# Patient Record
Sex: Male | Born: 1990 | Race: Black or African American | Hispanic: No | Marital: Married | Smoking: Current every day smoker
Health system: Southern US, Community
[De-identification: ages and names within clinical notes are randomized; demographics above are authoritative.]

## PROBLEM LIST (undated history)

## (undated) HISTORY — PX: BACK SURGERY: SHX140

---

## 2017-03-19 ENCOUNTER — Encounter (HOSPITAL_COMMUNITY): Payer: Self-pay

## 2017-03-19 ENCOUNTER — Emergency Department (HOSPITAL_COMMUNITY): Payer: No Typology Code available for payment source

## 2017-03-19 ENCOUNTER — Emergency Department (HOSPITAL_COMMUNITY)
Admission: EM | Admit: 2017-03-19 | Discharge: 2017-03-19 | Disposition: A | Discharge status: Home / Self Care | Payer: No Typology Code available for payment source | Attending: Emergency Medicine | Admitting: Emergency Medicine

## 2017-03-19 DIAGNOSIS — Y999 Unspecified external cause status: Secondary | ICD-10-CM | POA: Insufficient documentation

## 2017-03-19 DIAGNOSIS — F17210 Nicotine dependence, cigarettes, uncomplicated: Secondary | ICD-10-CM | POA: Insufficient documentation

## 2017-03-19 DIAGNOSIS — Y929 Unspecified place or not applicable: Secondary | ICD-10-CM | POA: Diagnosis not present

## 2017-03-19 DIAGNOSIS — T07XXXA Unspecified multiple injuries, initial encounter: Secondary | ICD-10-CM

## 2017-03-19 DIAGNOSIS — M545 Low back pain, unspecified: Secondary | ICD-10-CM

## 2017-03-19 DIAGNOSIS — Y9389 Activity, other specified: Secondary | ICD-10-CM | POA: Insufficient documentation

## 2017-03-19 DIAGNOSIS — M25561 Pain in right knee: Secondary | ICD-10-CM | POA: Diagnosis not present

## 2017-03-19 DIAGNOSIS — S40811A Abrasion of right upper arm, initial encounter: Secondary | ICD-10-CM | POA: Insufficient documentation

## 2017-03-19 DIAGNOSIS — S80812A Abrasion, left lower leg, initial encounter: Secondary | ICD-10-CM | POA: Diagnosis not present

## 2017-03-19 DIAGNOSIS — S40812A Abrasion of left upper arm, initial encounter: Secondary | ICD-10-CM | POA: Diagnosis not present

## 2017-03-19 DIAGNOSIS — S80811A Abrasion, right lower leg, initial encounter: Secondary | ICD-10-CM | POA: Diagnosis not present

## 2017-03-19 MED ORDER — NAPROXEN 375 MG PO TABS: 375.0000 mg | ORAL_TABLET | Freq: Two times a day (BID) | ORAL | 0 refills | Status: DC

## 2017-03-19 MED ORDER — NAPROXEN 250 MG PO TABS: 500.0000 mg | ORAL_TABLET | Freq: Once | ORAL | Status: DC

## 2017-03-19 MED ORDER — CYCLOBENZAPRINE HCL 10 MG PO TABS: 10.0000 mg | ORAL_TABLET | Freq: Three times a day (TID) | ORAL | 0 refills | Status: AC | PRN
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
## 2017-03-19 NOTE — Discharge Instructions (Signed)
Your x-ray of your knee showed no fracture. With a knee immobilizer and use the crutches as needed for comfort. Please take the Naproxen as prescribed for pain. Do not take any additional NSAIDs including Motrin, Aleve, Ibuprofen, Advil. Please the the Flexeril for muscle relaxation. This medication will make you drowsy so avoid situation that could place you in danger.   Please rest, ice, compress and elevated the affected body part to help with swelling and pain.  May need to follow-up with Dr. Danielle DessElsner which is a neurosurgery if back pain persists.   May need to follow up with Dr. Ophelia CharterYates if knee pain persists.  Keep abrasions clean and dry. Watch out for signs of infection.

## 2017-03-19 NOTE — ED Notes (Signed)
Pt in x-ray when called. X-ray will transport pt to room.
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
## 2017-03-19 NOTE — Progress Notes (Signed)
Orthopedic Tech Progress Note Patient Details:  Tyler Kerr [birth year] [id number]  Ortho Devices Type of Ortho Device: Knee Immobilizer Ortho Device/Splint Location: RLE Ortho Device/Splint Interventions: Ordered, Application   [name], [name] 03/19/2017, 1:58 PM

## 2017-03-19 NOTE — ED Notes (Signed)
Ellsner to call T. Leaphart @[phone] 

## 2018-01-06 ENCOUNTER — Emergency Department (HOSPITAL_COMMUNITY): Payer: No Typology Code available for payment source

## 2018-01-06 ENCOUNTER — Emergency Department (HOSPITAL_COMMUNITY)
Admission: EM | Admit: 2018-01-06 | Discharge: 2018-01-07 | Disposition: A | Discharge status: Home / Self Care | Payer: No Typology Code available for payment source | Attending: Emergency Medicine | Admitting: Emergency Medicine

## 2018-01-06 ENCOUNTER — Other Ambulatory Visit: Payer: Self-pay

## 2018-01-06 ENCOUNTER — Encounter (HOSPITAL_COMMUNITY): Payer: Self-pay

## 2018-01-06 DIAGNOSIS — Y929 Unspecified place or not applicable: Secondary | ICD-10-CM | POA: Insufficient documentation

## 2018-01-06 DIAGNOSIS — M545 Low back pain, unspecified: Secondary | ICD-10-CM

## 2018-01-06 DIAGNOSIS — S30810A Abrasion of lower back and pelvis, initial encounter: Secondary | ICD-10-CM | POA: Diagnosis not present

## 2018-01-06 DIAGNOSIS — S299XXA Unspecified injury of thorax, initial encounter: Secondary | ICD-10-CM | POA: Diagnosis present

## 2018-01-06 DIAGNOSIS — S20411A Abrasion of right back wall of thorax, initial encounter: Secondary | ICD-10-CM | POA: Insufficient documentation

## 2018-01-06 DIAGNOSIS — R0781 Pleurodynia: Secondary | ICD-10-CM | POA: Insufficient documentation

## 2018-01-06 DIAGNOSIS — Y939 Activity, unspecified: Secondary | ICD-10-CM | POA: Insufficient documentation

## 2018-01-06 DIAGNOSIS — Y999 Unspecified external cause status: Secondary | ICD-10-CM | POA: Insufficient documentation

## 2018-01-06 DIAGNOSIS — F17200 Nicotine dependence, unspecified, uncomplicated: Secondary | ICD-10-CM | POA: Diagnosis not present

## 2018-01-06 DIAGNOSIS — T07XXXA Unspecified multiple injuries, initial encounter: Secondary | ICD-10-CM
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
## 2018-01-07 MED ORDER — IBUPROFEN 400 MG PO TABS: 400.0000 mg | ORAL_TABLET | Freq: Four times a day (QID) | ORAL | 0 refills | Status: AC | PRN

## 2018-01-07 MED ORDER — ACETAMINOPHEN 325 MG PO TABS: 650.0000 mg | ORAL_TABLET | Freq: Four times a day (QID) | ORAL | 0 refills | Status: AC | PRN

## 2018-01-07 MED ORDER — METHOCARBAMOL 500 MG PO TABS: 500.0000 mg | ORAL_TABLET | Freq: Two times a day (BID) | ORAL | 0 refills | Status: DC

## 2018-01-07 NOTE — Discharge Instructions (Signed)

## 2018-02-19 ENCOUNTER — Other Ambulatory Visit: Payer: Self-pay

## 2018-02-19 ENCOUNTER — Emergency Department (HOSPITAL_COMMUNITY)
Admission: EM | Admit: 2018-02-19 | Discharge: 2018-02-20 | Disposition: A | Discharge status: Home / Self Care | Payer: Self-pay | Attending: Emergency Medicine | Admitting: Emergency Medicine

## 2018-02-19 ENCOUNTER — Encounter (HOSPITAL_COMMUNITY): Payer: Self-pay | Admitting: Emergency Medicine

## 2018-02-19 ENCOUNTER — Emergency Department (HOSPITAL_COMMUNITY): Payer: Self-pay

## 2018-02-19 DIAGNOSIS — M545 Low back pain, unspecified: Secondary | ICD-10-CM

## 2018-02-19 DIAGNOSIS — Y9289 Other specified places as the place of occurrence of the external cause: Secondary | ICD-10-CM | POA: Insufficient documentation

## 2018-02-19 DIAGNOSIS — T148XXA Other injury of unspecified body region, initial encounter: Secondary | ICD-10-CM

## 2018-02-19 DIAGNOSIS — X500XXA Overexertion from strenuous movement or load, initial encounter: Secondary | ICD-10-CM | POA: Insufficient documentation

## 2018-02-19 DIAGNOSIS — Y990 Civilian activity done for income or pay: Secondary | ICD-10-CM | POA: Insufficient documentation

## 2018-02-19 DIAGNOSIS — F17210 Nicotine dependence, cigarettes, uncomplicated: Secondary | ICD-10-CM | POA: Insufficient documentation

## 2018-02-19 DIAGNOSIS — S39012A Strain of muscle, fascia and tendon of lower back, initial encounter: Secondary | ICD-10-CM | POA: Insufficient documentation

## 2018-02-19 DIAGNOSIS — Y9389 Activity, other specified: Secondary | ICD-10-CM | POA: Insufficient documentation

## 2018-02-19 NOTE — ED Triage Notes (Signed)
Pt reports L lower back pain after carrying something heavy at work and hearing a "pop" Pt is ambulatory, has tried no OTC measures.

## 2018-02-20 MED ORDER — METHOCARBAMOL 500 MG PO TABS: 500.0000 mg | ORAL_TABLET | Freq: Two times a day (BID) | ORAL | 0 refills | Status: AC

## 2018-02-20 MED ORDER — METHOCARBAMOL 500 MG PO TABS
500.0000 mg | ORAL_TABLET | Freq: Once | ORAL | Status: AC
  Administered 2018-02-20: 500 mg via ORAL
  Filled 2018-02-20: qty 1

## 2018-02-20 MED ORDER — ACETAMINOPHEN 325 MG PO TABS
650.0000 mg | ORAL_TABLET | Freq: Once | ORAL | Status: AC
  Administered 2018-02-20: 650 mg via ORAL
  Filled 2018-02-20: qty 2

## 2018-02-20 MED ORDER — NAPROXEN 500 MG PO TABS: 500.0000 mg | ORAL_TABLET | Freq: Two times a day (BID) | ORAL | 0 refills | Status: AC

## 2018-02-20 NOTE — ED Provider Notes (Signed)
[facility] EMERGENCY DEPARTMENT Provider Note   CSN: [id number] Arrival date & time: 02/19/18  2240     History   Chief Complaint Chief Complaint  Patient presents with  . Back Pain    HPI Tyler Kerr is a 27 y.o. male.  27 y/o male with a PMH of scoliosis presents to the ED complaining of lower back pain x 2 hours. Patient states he was lifting a heavy box when he felt his back give out.Patient currently works for [employer]. Patient describes this pain as a pulling sensation located on his lower back region especially left side with no radiation. Patient is able to ambulate. He denies any abdominal pain, previous history of back pain or any bowel or bladder incontinence.      History reviewed. No pertinent past medical history.  There are no active problems to display for this patient.   Past Surgical History:  Procedure Laterality Date  . BACK SURGERY          Home Medications    Prior to Admission medications   Medication Sig Start Date End Date Taking? Authorizing Provider  acetaminophen (TYLENOL) 325 MG tablet Take 2 tablets (650 mg total) by mouth every 6 (six) hours as needed. Do not take more than 4000mg  of tylenol per day 01/07/18   Couture, Cortni S, PA-C  cyclobenzaprine (FLEXERIL) 10 MG tablet Take 1 tablet (10 mg total) by mouth 3 (three) times daily as needed for muscle spasms. 03/19/17   Rise Mu, PA-C  ibuprofen (ADVIL,MOTRIN) 400 MG tablet Take 1 tablet (400 mg total) by mouth every 6 (six) hours as needed. 01/07/18   Couture, Cortni S, PA-C  methocarbamol (ROBAXIN) 500 MG tablet Take 1 tablet (500 mg total) by mouth 2 (two) times daily for 7 days. 02/20/18 02/27/18  Claude Manges, PA-C  naproxen (NAPROSYN) 500 MG tablet Take 1 tablet (500 mg total) by mouth 2 (two) times daily. 02/20/18   Claude Manges, PA-C    Family History No family history on file.  Social History Social History   Tobacco Use  . Smoking status: Current Every  Day Smoker    Packs/day: 0.50  . Smokeless tobacco: Never Used  Substance Use Topics  . Alcohol use: Yes    Comment: occ  . Drug use: No     Allergies   Patient has no known allergies.   Review of Systems Review of Systems  Constitutional: Negative for chills and fever.  Respiratory: Negative for shortness of breath.   Cardiovascular: Negative for chest pain and palpitations.  Gastrointestinal: Negative for abdominal pain, diarrhea, nausea and vomiting.  Genitourinary: Negative for flank pain and hematuria.  Musculoskeletal: Positive for back pain and myalgias. Negative for gait problem, neck pain and neck stiffness.  Skin: Negative for color change and rash.  Neurological: Negative for light-headedness and headaches.  All other systems reviewed and are negative.    Physical Exam Updated Vital Signs BP 118/76 (BP Location: Left Arm)   Pulse 60   Temp 98.3 F (36.8 C) (Oral)   Resp 18   Ht 5\' 11"  (1.803 m)   Wt 90.7 kg (200 lb)   SpO2 100%   BMI 27.89 kg/m   Physical Exam  Constitutional: He appears well-developed and well-nourished.  HENT:  Head: Normocephalic and atraumatic.  Eyes: Conjunctivae are normal.  Neck: Neck supple.  Cardiovascular: Normal rate and regular rhythm.  No murmur heard. Pulmonary/Chest: Effort normal and breath sounds normal. No respiratory distress.  Abdominal: Soft. There is no tenderness.  Musculoskeletal: He exhibits tenderness. He exhibits no edema.       Cervical back: He exhibits no tenderness and no pain.       Thoracic back: He exhibits no tenderness and no pain.       Lumbar back: He exhibits pain. He exhibits no tenderness and no bony tenderness.       Back:  Tenderness & pain upon palpation to the left lumbar region.No midline tenderness.  Neurological: He is alert.  Skin: Skin is warm and dry.  Psychiatric: He has a normal mood and affect.  Nursing note and vitals reviewed.    ED Treatments / Results  Labs (all  labs ordered are listed, but only abnormal results are displayed) Labs Reviewed - No data to display  EKG None  Radiology Dg Lumbar Spine 2-3 Views  Result Date: 02/19/2018 CLINICAL DATA:  Low back pain EXAM: LUMBAR SPINE - 2-3 VIEW COMPARISON:  01/06/2018 FINDINGS: Partially visualized rods and fixating screws in the thoracolumbar spine. Similar appearance of residual levoscoliosis of the lumbar spine. Vertebral body heights are maintained. Disc spaces are within normal limits. Fracture through fixating screw at L1, no change. IMPRESSION: Scoliosis.  No significant change compared to prior Electronically Signed   By: Jasmine PangKim  Fujinaga M.D.   On: 02/19/2018 23:11    Procedures Procedures (including critical care time)  Medications Ordered in ED Medications  acetaminophen (TYLENOL) tablet 650 mg (has no administration in time range)  methocarbamol (ROBAXIN) tablet 500 mg (has no administration in time range)     Initial Impression / Assessment and Plan / ED Course  I have reviewed the triage vital signs and the nursing notes.  Pertinent labs & imaging results that were available during my care of the patient were reviewed by me and considered in my medical decision making (see chart for details).     Patient presents to the ED complaining of lower back pain x 2 hours. He stated the pain started when he was lifting a heavy box at work. Patient denies any previous history of back pain or bladder/bowel incontinence. Lumbar imaging showed no abnormalities. Patient is ambulatory at this time. No neurological findings.Patient describes his pain as a pulling not tearing, he has no cardiac history.No urinary complains at this time I have explained to the patient to apply warm compresses, ice and light duty for a few days.I have prescribed pain medication and muscle relaxer to take as needed.  Final Clinical Impressions(s) / ED Diagnoses   Final diagnoses:  Acute left-sided low back pain without  sciatica  Muscle strain    ED Discharge Orders        Ordered    naproxen (NAPROSYN) 500 MG tablet  2 times daily     02/20/18 0045    methocarbamol (ROBAXIN) 500 MG tablet  2 times daily     02/20/18 0045       Claude MangesSoto, Momina Hunton, PA-C 02/20/18 0111    Wilkie AyeHorton, Mayer Maskerourtney F, MD 02/20/18 [phone]

## 2018-02-20 NOTE — Discharge Instructions (Addendum)
Today you were seen in the ED and diagnosed with acute low back pain without sciatica and muscle strain.I have prescribed pain medication, please take as directed. Do not drink or drive while taking this medication as it can make you drowsy. For additional pain relief ibuprofen and tylenol can be alternated. Please return to the ED if you experience any of the following symptoms.  You develop new bowel or bladder control problems. You have unusual weakness or numbness in your arms or legs. You develop nausea or vomiting. You develop abdominal pain. You feel faint.

## 2018-02-20 NOTE — ED Notes (Signed)
ED Provider at bedside. 

## 2018-03-19 ENCOUNTER — Other Ambulatory Visit: Payer: Self-pay

## 2018-03-19 ENCOUNTER — Encounter (HOSPITAL_COMMUNITY): Payer: Self-pay | Admitting: Emergency Medicine

## 2018-03-19 ENCOUNTER — Emergency Department (HOSPITAL_COMMUNITY)
Admission: EM | Admit: 2018-03-19 | Discharge: 2018-03-19 | Disposition: A | Discharge status: Home / Self Care | Payer: Self-pay | Attending: Emergency Medicine | Admitting: Emergency Medicine

## 2018-03-19 DIAGNOSIS — Z7689 Persons encountering health services in other specified circumstances: Secondary | ICD-10-CM | POA: Insufficient documentation

## 2018-03-19 DIAGNOSIS — Z79899 Other long term (current) drug therapy: Secondary | ICD-10-CM | POA: Insufficient documentation

## 2018-03-19 DIAGNOSIS — F17200 Nicotine dependence, unspecified, uncomplicated: Secondary | ICD-10-CM | POA: Insufficient documentation

## 2018-03-19 NOTE — Discharge Instructions (Signed)
Please follow-up with the Workmen's Comp. facility of record for any further management of this complaint.

## 2018-03-19 NOTE — ED Provider Notes (Signed)
[facility] Provider Note   CSN: [id number] Arrival date & time: 03/19/18  [id number]     History   Chief Complaint Chief Complaint  Patient presents with  . Letter for School/Work    HPI Tyler Kerr is a 27 y.o. male.  HPI   Tyler Kerr is a 27 y.o. male, presenting to the ED with request for a return to work note.  States he was seen for a back injury here in the ED at the beginning of July.  His symptoms resolved within a couple weeks.  He has Workmen's Comp. paperwork that he says [facility], however, he has not seen them and was not told he needed to see them.  Denies pain, numbness, weakness, or other complaints.  History reviewed. No pertinent past medical history.  There are no active problems to display for this patient.   Past Surgical History:  Procedure Laterality Date  . BACK SURGERY          Home Medications    Prior to Admission medications   Medication Sig Start Date End Date Taking? Authorizing Provider  acetaminophen (TYLENOL) 325 MG tablet Take 2 tablets (650 mg total) by mouth every 6 (six) hours as needed. Do not take more than 4000mg  of tylenol per day 01/07/18   Couture, Cortni S, PA-C  cyclobenzaprine (FLEXERIL) 10 MG tablet Take 1 tablet (10 mg total) by mouth 3 (three) times daily as needed for muscle spasms. 03/19/17   Rise MuLeaphart, Kenneth T, PA-C  ibuprofen (ADVIL,MOTRIN) 400 MG tablet Take 1 tablet (400 mg total) by mouth every 6 (six) hours as needed. 01/07/18   Couture, Cortni S, PA-C  naproxen (NAPROSYN) 500 MG tablet Take 1 tablet (500 mg total) by mouth 2 (two) times daily. 02/20/18   Claude MangesSoto, Johana, PA-C    Family History No family history on file.  Social History Social History   Tobacco Use  . Smoking status: Current Every Day Smoker    Packs/day: 0.50  . Smokeless tobacco: Never Used  Substance Use Topics  . Alcohol use: Yes    Comment: occ  . Drug use: No      Allergies   Patient has no known allergies.   Review of Systems Review of Systems  Constitutional:       Return to work note  Musculoskeletal: Negative for back pain and neck pain.  Neurological: Negative for weakness and numbness.     Physical Exam Updated Vital Signs BP 124/90 (BP Location: Right Arm)   Pulse 64   Temp 98.6 F (37 C) (Oral)   Resp 20   SpO2 98%   Physical Exam  Constitutional: He appears well-developed and well-nourished. No distress.  HENT:  Head: Normocephalic and atraumatic.  Eyes: Conjunctivae are normal.  Neck: Neck supple.  Cardiovascular: Normal rate and regular rhythm.  Pulmonary/Chest: Effort normal.  Musculoskeletal: He exhibits no tenderness.  Patient has no noted tenderness, swelling, or other abnormality throughout the bilateral lower back or buttocks.  He readily twists and bends over without voiced pain or hesitation. Normal motor function intact in all extremities and spine. No midline spinal tenderness.   Neurological: He is alert.  Sensation grossly intact to the lower extremities bilaterally. No saddle anesthesias. Strength 5/5 with flexion and extension at the bilateral hips, knees, and ankles. No noted gait deficit. Coordination intact with heel to shin testing.  Skin: Skin is warm and dry. He is not diaphoretic. No pallor.  Psychiatric: He has a normal mood and affect. His behavior is normal.  Nursing note and vitals reviewed.    ED Treatments / Results  Labs (all labs ordered are listed, but only abnormal results are displayed) Labs Reviewed - No data to display  EKG None  Radiology No results found.  Procedures Procedures (including critical care time)  Medications Ordered in ED Medications - No data to display   Initial Impression / Assessment and Plan / ED Course  I have reviewed the triage vital signs and the nursing notes.  Pertinent labs & imaging results that were available during my care of the  patient were reviewed by me and considered in my medical decision making (see chart for details).     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 Also stated I could not certify that the patient is fit for his job, I can merely state the findings of my exam and that there are no findings that would make me keep him out of work.  He was advised to follow-up with [facility].     Final Clinical Impressions(s) / ED Diagnoses   Final diagnoses:  Return to work evaluation    ED Discharge Orders    None       Concepcion Living 03/19/18 Earl Gala, MD 03/20/18 1005

## 2018-03-19 NOTE — ED Notes (Signed)
Patient able to ambulate independently  

## 2018-03-19 NOTE — ED Triage Notes (Signed)
need work note

## 2019-02-09 ENCOUNTER — Other Ambulatory Visit: Payer: Self-pay

## 2019-02-09 ENCOUNTER — Encounter (HOSPITAL_COMMUNITY): Payer: Self-pay | Admitting: Emergency Medicine

## 2019-02-09 ENCOUNTER — Emergency Department (HOSPITAL_COMMUNITY)
Admission: EM | Admit: 2019-02-09 | Discharge: 2019-02-09 | Disposition: A | Discharge status: Home / Self Care | Payer: Self-pay | Attending: Emergency Medicine | Admitting: Emergency Medicine

## 2019-02-09 ENCOUNTER — Emergency Department (HOSPITAL_COMMUNITY): Payer: Self-pay

## 2019-02-09 DIAGNOSIS — S62323A Displaced fracture of shaft of third metacarpal bone, left hand, initial encounter for closed fracture: Secondary | ICD-10-CM | POA: Insufficient documentation

## 2019-02-09 DIAGNOSIS — Y999 Unspecified external cause status: Secondary | ICD-10-CM | POA: Insufficient documentation

## 2019-02-09 DIAGNOSIS — Y929 Unspecified place or not applicable: Secondary | ICD-10-CM | POA: Insufficient documentation

## 2019-02-09 DIAGNOSIS — S6292XA Unspecified fracture of left wrist and hand, initial encounter for closed fracture: Secondary | ICD-10-CM

## 2019-02-09 DIAGNOSIS — Y939 Activity, unspecified: Secondary | ICD-10-CM | POA: Insufficient documentation

## 2019-02-09 DIAGNOSIS — W19XXXA Unspecified fall, initial encounter: Secondary | ICD-10-CM | POA: Insufficient documentation

## 2019-02-09 DIAGNOSIS — S62325A Displaced fracture of shaft of fourth metacarpal bone, left hand, initial encounter for closed fracture: Secondary | ICD-10-CM | POA: Insufficient documentation

## 2019-02-09 MED ORDER — HYDROCODONE-ACETAMINOPHEN 5-325 MG PO TABS: 1.0000 | ORAL_TABLET | ORAL | 0 refills | Status: AC | PRN
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
## 2019-02-09 NOTE — Progress Notes (Signed)
Orthopedic Tech Progress Note Patient Details:  Duante Arocho [birth year] [id number]  Ortho Devices Type of Ortho Device: Ulna gutter splint, Arm sling Ortho Device/Splint Location: ULE Ortho Device/Splint Interventions: Adjustment, Application, Ordered   Post Interventions Patient Tolerated: Well Instructions Provided: Care of device, Adjustment of device   Janit Pagan 02/09/2019, 2:44 PM

## 2019-02-09 NOTE — Discharge Instructions (Addendum)
You xray today showed a fracture to your fourth and fifth metacarpal. A splint has been placed to your left hand, please keep this clean and dry. I  have also provided a prescription for pain medication, please take this as directed. Dr. Luanna Cole information is attached to your chart, please call to schedule an appointment.

## 2019-02-09 NOTE — ED Notes (Signed)
Patient verbalizes understanding of discharge instructions. Opportunity for questioning and answers were provided. Armband removed by staff, pt discharged from ED. Ambulated out to lobby  
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
## 2019-02-12 ENCOUNTER — Ambulatory Visit: Payer: Self-pay | Admitting: Orthopedic Surgery

## 2019-02-13 ENCOUNTER — Other Ambulatory Visit (HOSPITAL_COMMUNITY): Payer: Self-pay | Attending: Orthopedic Surgery

## 2019-02-13 NOTE — Progress Notes (Signed)
Voicemail message left for Tyler Kerr to inquire his arrival for Covid 19 screen today. Attempted to contact Tyler Kerr  again this afternoon for him to call to reschedule for Covid 19 for 6/26

## 2019-02-13 NOTE — Progress Notes (Signed)
Update to Dr Mardelle Matte office and Amedeo Plenty at [facility]. Mr Gurski did not arrive today for Covid 19 screening. Message left for Mr Spivack to call and hopefully schedule for tomorrow 6/26 prior to his procedure on 6/29.

## 2019-02-17 ENCOUNTER — Ambulatory Visit (HOSPITAL_BASED_OUTPATIENT_CLINIC_OR_DEPARTMENT_OTHER): Admission: RE | Admit: 2019-02-17 | Payer: Self-pay | Source: Home / Self Care | Admitting: Orthopedic Surgery

## 2019-02-17 SURGERY — OPEN REDUCTION INTERNAL FIXATION (ORIF) METACARPAL
Anesthesia: Choice | Laterality: Left

## 2019-03-27 ENCOUNTER — Other Ambulatory Visit: Payer: Self-pay

## 2019-03-27 ENCOUNTER — Emergency Department (HOSPITAL_COMMUNITY)
Admission: EM | Admit: 2019-03-27 | Discharge: 2019-03-27 | Disposition: A | Discharge status: Home / Self Care | Payer: Self-pay | Attending: Emergency Medicine | Admitting: Emergency Medicine

## 2019-03-27 ENCOUNTER — Encounter (HOSPITAL_COMMUNITY): Payer: Self-pay

## 2019-03-27 DIAGNOSIS — Z202 Contact with and (suspected) exposure to infections with a predominantly sexual mode of transmission: Secondary | ICD-10-CM | POA: Insufficient documentation

## 2019-03-27 DIAGNOSIS — F17210 Nicotine dependence, cigarettes, uncomplicated: Secondary | ICD-10-CM | POA: Insufficient documentation

## 2019-03-27 DIAGNOSIS — Z711 Person with feared health complaint in whom no diagnosis is made: Secondary | ICD-10-CM

## 2019-03-27 MED ORDER — LIDOCAINE HCL (PF) 1 % IJ SOLN
INTRAMUSCULAR | Status: AC
  Administered 2019-03-27: 12:00:00 5 mL
  Filled 2019-03-27: qty 5

## 2019-03-27 MED ORDER — AZITHROMYCIN 250 MG PO TABS
1000.0000 mg | ORAL_TABLET | Freq: Once | ORAL | Status: AC
  Administered 2019-03-27: 12:00:00 1000 mg via ORAL
  Filled 2019-03-27: qty 4

## 2019-03-27 MED ORDER — CEFTRIAXONE SODIUM 250 MG IJ SOLR
250.0000 mg | Freq: Once | INTRAMUSCULAR | Status: AC
  Administered 2019-03-27: 250 mg via INTRAMUSCULAR
  Filled 2019-03-27: qty 250

## 2019-03-27 NOTE — ED Triage Notes (Signed)
Pt states that his wife is being tested for std. Pt denies symptoms.

## 2019-03-27 NOTE — Discharge Instructions (Signed)
Follow up with [facility]D clinic to be screened for HIV in the future and for future STD concerns or screenings. This is the recommendation by the CDC for people with multiple sexual partners or hx of STDs. You have been treated for gonorrhea and chlamydia in the ER but the hospital will call you if lab is positive.  Do not have sexual intercourse for 7 days after antibiotic treatment. ° °

## 2019-03-27 NOTE — ED Provider Notes (Signed)
[facility] EMERGENCY DEPARTMENT Provider Note   CSN: [id number] Arrival date & time: 03/27/19  1027    History   Chief Complaint Chief Complaint  Patient presents with  . Exposure to STD    HPI Tyler Kerr is a 28 y.o. male presents to the ED with concern for possible STD exposure.  He reports that his wife was recently told that she tested positive for something but he is unsure what.  He states that she has had abnormal vaginal discharge and that she is prone to BV but when she went to get evaluated for this she tested negative for BV but tested positive for something else.  He denies any urinary symptoms, urethral discharge, testicular pain or scrotal swelling, pain with bowel movements, abdominal pain, nausea, vomiting, fevers, diarrhea, or genital sores.  He is sexually active with his wife and no other partners but does not use protection.     The history is provided by the patient.    History reviewed. No pertinent past medical history.  There are no active problems to display for this patient.   Past Surgical History:  Procedure Laterality Date  . BACK SURGERY          Home Medications    Prior to Admission medications   Medication Sig Start Date End Date Taking? Authorizing Provider  acetaminophen (TYLENOL) 325 MG tablet Take 2 tablets (650 mg total) by mouth every 6 (six) hours as needed. Do not take more than 4000mg  of tylenol per day 01/07/18   Couture, Cortni S, PA-C  cyclobenzaprine (FLEXERIL) 10 MG tablet Take 1 tablet (10 mg total) by mouth 3 (three) times daily as needed for muscle spasms. 03/19/17   Doristine Devoid, PA-C  ibuprofen (ADVIL,MOTRIN) 400 MG tablet Take 1 tablet (400 mg total) by mouth every 6 (six) hours as needed. 01/07/18   Couture, Cortni S, PA-C  naproxen (NAPROSYN) 500 MG tablet Take 1 tablet (500 mg total) by mouth 2 (two) times daily. 02/20/18   Janeece Fitting, PA-C    Family History No family history on  file.  Social History Social History   Tobacco Use  . Smoking status: Current Every Day Smoker    Packs/day: 0.50  . Smokeless tobacco: Never Used  Substance Use Topics  . Alcohol use: Yes    Comment: occ  . Drug use: No     Allergies   Patient has no known allergies.   Review of Systems Review of Systems  Constitutional: Negative for chills and fever.  Gastrointestinal: Negative for abdominal pain, blood in stool, nausea, rectal pain and vomiting.  Genitourinary: Negative for decreased urine volume, discharge, dysuria, flank pain, frequency, genital sores, hematuria, penile pain, penile swelling, scrotal swelling, testicular pain and urgency.  All other systems reviewed and are negative.    Physical Exam Updated Vital Signs BP (!) 140/91 (BP Location: Right Arm)   Pulse (!) 57   Temp 97.9 F (36.6 C) (Oral)   Resp 16   SpO2 98%   Physical Exam Vitals signs and nursing note reviewed.  Constitutional:      General: He is not in acute distress.    Appearance: He is well-developed.     Comments: Resting comfortably in bed  HENT:     Head: Normocephalic and atraumatic.  Eyes:     General:        Right eye: No discharge.        Left eye: No discharge.  Conjunctiva/sclera: Conjunctivae normal.  Neck:     Vascular: No JVD.     Trachea: No tracheal deviation.  Cardiovascular:     Rate and Rhythm: Normal rate and regular rhythm.  Pulmonary:     Effort: Pulmonary effort is normal.     Breath sounds: Normal breath sounds.  Abdominal:     General: Abdomen is flat. Bowel sounds are normal. There is no distension.     Palpations: Abdomen is soft.     Tenderness: There is no abdominal tenderness. There is no right CVA tenderness, left CVA tenderness, guarding or rebound.  Genitourinary:    Comments: Deferred as patient is asymptomatic Skin:    General: Skin is warm and dry.     Findings: No erythema.  Neurological:     Mental Status: He is alert.   Psychiatric:        Behavior: Behavior normal.      ED Treatments / Results  Labs (all labs ordered are listed, but only abnormal results are displayed) Labs Reviewed  [facility]    EKG None  Radiology No results found.  Procedures Procedures (including critical care time)  Medications Ordered in ED Medications  cefTRIAXone (ROCEPHIN) injection 250 mg (has no administration in time range)  azithromycin (ZITHROMAX) tablet 1,000 mg (has no administration in time range)     Initial Impression / Assessment and Plan / ED Course  I have reviewed the triage vital signs and the nursing notes.  Pertinent labs & imaging results that were available during my care of the patient were reviewed by me and considered in my medical decision making (see chart for details).        Patient presenting requesting treatment for possible STD exposure.  He has no symptoms.  He is afebrile, vital signs are stable.  He is nontoxic in appearance.  Patient is afebrile without abdominal tenderness, abdominal pain or painful bowel movements to indicate prostatitis.  STD cultures obtained including gonorrhea and chlamydia. Patient to be discharged with instructions to follow up with health department for any future STD testing or treatment. Discussed importance of using protection when sexually active. Pt understands that they have GC/Chlamydia cultures pending and that they will need to inform all sexual partners if results return positive. Patient has been treated prophylactically with azithromycin and Rocephin.  Discuss strict ED return precautions. Pt verbalized understanding of and agreement with plan and is safe for discharge home at this time.    Final Clinical Impressions(s) / ED Diagnoses   Final diagnoses:  Concern about STD in male without diagnosis    ED Discharge Orders    None       Jeanie SewerFawze, Zakiyah Diop A, PA-C 03/27/19 1137    Rolan BuccoBelfi, Melanie, MD  03/27/19 1225

## 2019-03-28 LAB — GC/CHLAMYDIA PROBE AMP (~~LOC~~) NOT AT ARMC
Chlamydia: NEGATIVE
Neisseria Gonorrhea: NEGATIVE
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
## 2019-08-09 ENCOUNTER — Encounter (HOSPITAL_COMMUNITY): Payer: Self-pay | Admitting: *Deleted

## 2019-08-09 ENCOUNTER — Other Ambulatory Visit: Payer: Self-pay

## 2019-08-09 ENCOUNTER — Emergency Department (HOSPITAL_COMMUNITY)
Admission: EM | Admit: 2019-08-09 | Discharge: 2019-08-09 | Disposition: A | Discharge status: Home / Self Care | Payer: Self-pay | Attending: Emergency Medicine | Admitting: Emergency Medicine

## 2019-08-09 DIAGNOSIS — M549 Dorsalgia, unspecified: Secondary | ICD-10-CM | POA: Insufficient documentation

## 2019-08-09 DIAGNOSIS — B029 Zoster without complications: Secondary | ICD-10-CM | POA: Insufficient documentation

## 2019-08-09 DIAGNOSIS — F17210 Nicotine dependence, cigarettes, uncomplicated: Secondary | ICD-10-CM | POA: Insufficient documentation

## 2019-08-09 MED ORDER — OXYCODONE-ACETAMINOPHEN 5-325 MG PO TABS: 1.0000 | ORAL_TABLET | ORAL | 0 refills | Status: AC | PRN

## 2019-08-09 MED ORDER — ACYCLOVIR 400 MG PO TABS: 800.0000 mg | ORAL_TABLET | Freq: Every day | ORAL | 0 refills | Status: AC

## 2019-08-09 MED ORDER — ACYCLOVIR 200 MG PO CAPS
800.0000 mg | ORAL_CAPSULE | Freq: Once | ORAL | Status: AC
  Administered 2019-08-09: 23:00:00 800 mg via ORAL
  Filled 2019-08-09: qty 4

## 2019-08-09 NOTE — ED Notes (Signed)
[place] to send medication

## 2019-08-09 NOTE — ED Triage Notes (Signed)
The pt has a rash to his lt lateral chest and flank  The rash appears to be shingles/  Pt c/o acute pain

## 2019-08-09 NOTE — Discharge Instructions (Signed)
Take the prescribed medication as directed. °Follow-up with your primary care doctor. °Return to the ED for new or worsening symptoms. °

## 2019-08-09 NOTE — ED Notes (Signed)
Patient verbalizes understanding of discharge instructions. Opportunity for questioning and answers were provided. Armband removed by staff, pt discharged from ED ambulatory to home.  

## 2019-08-09 NOTE — ED Provider Notes (Signed)
[facility] EMERGENCY DEPARTMENT Provider Note   CSN: [id number] Arrival date & time: 08/09/19  2129     History Chief Complaint  Patient presents with  . Rash    Tyler Kerr is a 28 y.o. male.  The history is provided by the patient and medical records.  Rash   28 year old male presenting to the ED with low back pain and rash.  Patient first started noticing this about 2 days ago.  States initially he thought he had pulled a muscle in his back.  Started supportive care measures without any relief.  Rash has gotten worse but has not extended.  Rash begins in left lower back and radiates around to the left lateral abdomen.  No N/V/D.  No fever/chills.  No hx of HIV, DM, or other immunocompromised state.  He did have chicken pox as a child.  No meds PTA.  History reviewed. No pertinent past medical history.  There are no problems to display for this patient.   Past Surgical History:  Procedure Laterality Date  . BACK SURGERY         No family history on file.  Social History   Tobacco Use  . Smoking status: Current Every Day Smoker    Packs/day: 0.50  . Smokeless tobacco: Never Used  Substance Use Topics  . Alcohol use: Yes    Comment: occ  . Drug use: No    Home Medications Prior to Admission medications   Medication Sig Start Date End Date Taking? Authorizing Provider  acetaminophen (TYLENOL) 325 MG tablet Take 2 tablets (650 mg total) by mouth every 6 (six) hours as needed. Do not take more than 4000mg  of tylenol per day 01/07/18   Couture, Cortni S, PA-C  cyclobenzaprine (FLEXERIL) 10 MG tablet Take 1 tablet (10 mg total) by mouth 3 (three) times daily as needed for muscle spasms. 03/19/17   Doristine Devoid, PA-C  ibuprofen (ADVIL,MOTRIN) 400 MG tablet Take 1 tablet (400 mg total) by mouth every 6 (six) hours as needed. 01/07/18   Couture, Cortni S, PA-C  naproxen (NAPROSYN) 500 MG tablet Take 1 tablet (500 mg total) by mouth 2  (two) times daily. 02/20/18   Janeece Fitting, PA-C    Allergies    Patient has no known allergies.  Review of Systems   Review of Systems  Skin: Positive for rash.  All other systems reviewed and are negative.   Physical Exam Updated Vital Signs BP 133/86   Pulse 84   Temp 98.1 F (36.7 C)   Resp 18   Ht 5\' 11"  (1.803 m)   Wt 90.7 kg   SpO2 98%   BMI 27.89 kg/m   Physical Exam Vitals and nursing note reviewed.  Constitutional:      Appearance: He is well-developed.  HENT:     Head: Normocephalic and atraumatic.  Eyes:     Conjunctiva/sclera: Conjunctivae normal.     Pupils: Pupils are equal, round, and reactive to light.  Cardiovascular:     Rate and Rhythm: Normal rate and regular rhythm.     Heart sounds: Normal heart sounds.  Pulmonary:     Effort: Pulmonary effort is normal.     Breath sounds: Normal breath sounds.  Abdominal:     General: Bowel sounds are normal.     Palpations: Abdomen is soft.  Musculoskeletal:        General: Normal range of motion.     Cervical back: Normal range of motion.  Comments: Rash tracking along left T12 dermatome but does not cross midline, grouped vesicles, no drainage, no signs of superimposed infection  Skin:    General: Skin is warm and dry.  Neurological:     Mental Status: He is alert and oriented to person, place, and time.     ED Results / Procedures / Treatments   Labs (all labs ordered are listed, but only abnormal results are displayed) Labs Reviewed - No data to display  EKG None  Radiology No results found.  Procedures Procedures (including critical care time)  Medications Ordered in ED Medications  acyclovir (ZOVIRAX) 200 MG capsule 800 mg (800 mg Oral Given 08/09/19 2254)    ED Course  I have reviewed the triage vital signs and the nursing notes.  Pertinent labs & imaging results that were available during my care of the patient were reviewed by me and considered in my medical decision making  (see chart for details).    MDM Rules/Calculators/A&P  28 year old male presenting to the ED with low back pain and rash that developed 2 days ago.  Thinks he pulled a muscle but has tried symptomatic care without much relief.  He is afebrile nontoxic in appearance.  On exam he has a grouped, vesicular rash tracking along the left T12 dermatome but does not cross the midline.  Rashes consistent with shingles.  He confirms he did have chickenpox as a child.  Will start on course of acyclovir and give pain control.  Can follow-up with PCP.  Return here for any new/acute changes.  Final Clinical Impression(s) / ED Diagnoses Final diagnoses:  Herpes zoster without complication    Rx / DC Orders ED Discharge Orders         Ordered    acyclovir (ZOVIRAX) 400 MG tablet  5 times daily     08/09/19 2241    oxyCODONE-acetaminophen (PERCOCET) 5-325 MG tablet  Every 4 hours PRN     08/09/19 2241           Garlon Hatchet, PA-C 08/09/19 2258    Lorre Nick, MD 08/10/19 [phone]

## 2019-10-20 ENCOUNTER — Emergency Department (HOSPITAL_COMMUNITY)
Admission: EM | Admit: 2019-10-20 | Discharge: 2019-10-20 | Disposition: A | Discharge status: Home / Self Care | Payer: Self-pay | Attending: Emergency Medicine | Admitting: Emergency Medicine

## 2019-10-20 ENCOUNTER — Encounter (HOSPITAL_COMMUNITY): Payer: Self-pay | Admitting: Emergency Medicine

## 2019-10-20 ENCOUNTER — Other Ambulatory Visit: Payer: Self-pay

## 2019-10-20 DIAGNOSIS — Z20822 Contact with and (suspected) exposure to covid-19: Secondary | ICD-10-CM | POA: Insufficient documentation

## 2019-10-20 DIAGNOSIS — Z5321 Procedure and treatment not carried out due to patient leaving prior to being seen by health care provider: Secondary | ICD-10-CM | POA: Insufficient documentation

## 2019-10-20 NOTE — ED Triage Notes (Signed)
Pt states he was exposed to Covid on the past week he said he lost some taste and smell for few days but is back, pt here to get tested for Covid.

## 2019-10-20 NOTE — ED Notes (Signed)
Pt caled x3 in the waiting room. No reponse

## 2020-07-05 ENCOUNTER — Emergency Department (HOSPITAL_COMMUNITY)
Admission: EM | Admit: 2020-07-05 | Discharge: 2020-07-05 | Disposition: A | Discharge status: Home / Self Care | Payer: 59 | Attending: Emergency Medicine | Admitting: Emergency Medicine

## 2020-07-05 ENCOUNTER — Emergency Department (HOSPITAL_COMMUNITY): Payer: 59

## 2020-07-05 DIAGNOSIS — Z5321 Procedure and treatment not carried out due to patient leaving prior to being seen by health care provider: Secondary | ICD-10-CM | POA: Diagnosis not present

## 2020-07-05 DIAGNOSIS — R079 Chest pain, unspecified: Secondary | ICD-10-CM | POA: Insufficient documentation

## 2020-07-05 LAB — CBC
HCT: 42.8 % (ref 39.0–52.0)
Hemoglobin: 13.7 g/dL (ref 13.0–17.0)
MCH: 27.6 pg (ref 26.0–34.0)
MCHC: 32 g/dL (ref 30.0–36.0)
MCV: 86.3 fL (ref 80.0–100.0)
Platelets: 253 10*3/uL (ref 150–400)
RBC: 4.96 MIL/uL (ref 4.22–5.81)
RDW: 14.6 % (ref 11.5–15.5)
WBC: 5.8 10*3/uL (ref 4.0–10.5)
nRBC: 0 % (ref 0.0–0.2)

## 2020-07-05 LAB — TROPONIN I (HIGH SENSITIVITY)
Troponin I (High Sensitivity): 7 ng/L (ref <18)
Troponin I (High Sensitivity): 7 ng/L (ref <18)

## 2020-07-05 LAB — BASIC METABOLIC PANEL
Anion gap: 8 (ref 5–15)
BUN: 16 mg/dL (ref 6–20)
CO2: 28 mmol/L (ref 22–32)
Calcium: 9.1 mg/dL (ref 8.9–10.3)
Chloride: 103 mmol/L (ref 98–111)
Creatinine, Ser: 1.51 mg/dL — ABNORMAL HIGH (ref 0.61–1.24)
GFR, Estimated: >60 mL/min (ref >60)
Glucose, Bld: 168 mg/dL — ABNORMAL HIGH (ref 70–99)
Potassium: 3.9 mmol/L (ref 3.5–5.1)
Sodium: 139 mmol/L (ref 135–145)
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
## 2020-10-10 ENCOUNTER — Other Ambulatory Visit: Payer: Self-pay

## 2020-10-10 ENCOUNTER — Emergency Department (HOSPITAL_COMMUNITY): Payer: Self-pay

## 2020-10-10 ENCOUNTER — Emergency Department (HOSPITAL_COMMUNITY)
Admission: EM | Admit: 2020-10-10 | Discharge: 2020-10-10 | Disposition: A | Discharge status: Home / Self Care | Payer: Self-pay | Attending: Emergency Medicine | Admitting: Emergency Medicine

## 2020-10-10 ENCOUNTER — Encounter (HOSPITAL_COMMUNITY): Payer: Self-pay | Admitting: Emergency Medicine

## 2020-10-10 DIAGNOSIS — S0081XA Abrasion of other part of head, initial encounter: Secondary | ICD-10-CM | POA: Insufficient documentation

## 2020-10-10 DIAGNOSIS — M542 Cervicalgia: Secondary | ICD-10-CM | POA: Insufficient documentation

## 2020-10-10 DIAGNOSIS — R456 Violent behavior: Secondary | ICD-10-CM | POA: Insufficient documentation

## 2020-10-10 DIAGNOSIS — Z008 Encounter for other general examination: Secondary | ICD-10-CM

## 2020-10-10 DIAGNOSIS — F17200 Nicotine dependence, unspecified, uncomplicated: Secondary | ICD-10-CM | POA: Insufficient documentation

## 2020-10-10 LAB — COMPREHENSIVE METABOLIC PANEL
ALT: 26 U/L (ref 0–44)
AST: 39 U/L (ref 15–41)
Albumin: 4.5 g/dL (ref 3.5–5.0)
Alkaline Phosphatase: 100 U/L (ref 38–126)
Anion gap: 10 (ref 5–15)
BUN: 9 mg/dL (ref 6–20)
CO2: 23 mmol/L (ref 22–32)
Calcium: 8.9 mg/dL (ref 8.9–10.3)
Chloride: 107 mmol/L (ref 98–111)
Creatinine, Ser: 1.33 mg/dL — ABNORMAL HIGH (ref 0.61–1.24)
GFR, Estimated: >60 mL/min (ref >60)
Glucose, Bld: 97 mg/dL (ref 70–99)
Potassium: 3.9 mmol/L (ref 3.5–5.1)
Sodium: 140 mmol/L (ref 135–145)
Total Bilirubin: 0.5 mg/dL (ref 0.3–1.2)
Total Protein: 7.4 g/dL (ref 6.5–8.1)

## 2020-10-10 LAB — CBC WITH DIFFERENTIAL/PLATELET
Abs Immature Granulocytes: 0.04 10*3/uL (ref 0.00–0.07)
Basophils Absolute: 0 10*3/uL (ref 0.0–0.1)
Basophils Relative: 1 %
Eosinophils Absolute: 0 10*3/uL (ref 0.0–0.5)
Eosinophils Relative: 0 %
HCT: 41.6 % (ref 39.0–52.0)
Hemoglobin: 14.1 g/dL (ref 13.0–17.0)
Immature Granulocytes: 1 %
Lymphocytes Relative: 12 %
Lymphs Abs: 1.1 10*3/uL (ref 0.7–4.0)
MCH: 28.7 pg (ref 26.0–34.0)
MCHC: 33.9 g/dL (ref 30.0–36.0)
MCV: 84.6 fL (ref 80.0–100.0)
Monocytes Absolute: 0.3 10*3/uL (ref 0.1–1.0)
Monocytes Relative: 3 %
Neutro Abs: 7.4 10*3/uL (ref 1.7–7.7)
Neutrophils Relative %: 83 %
Platelets: 241 10*3/uL (ref 150–400)
RBC: 4.92 MIL/uL (ref 4.22–5.81)
RDW: 14.7 % (ref 11.5–15.5)
WBC: 8.8 10*3/uL (ref 4.0–10.5)
nRBC: 0 % (ref 0.0–0.2)

## 2020-10-10 LAB — RAPID URINE DRUG SCREEN, HOSP PERFORMED
Amphetamines: NOT DETECTED
Barbiturates: NOT DETECTED
Benzodiazepines: NOT DETECTED
Cocaine: NOT DETECTED
Opiates: NOT DETECTED
Tetrahydrocannabinol: POSITIVE — AB

## 2020-10-10 LAB — ETHANOL: Alcohol, Ethyl (B): 163 mg/dL — ABNORMAL HIGH (ref <10)

## 2020-10-10 LAB — SALICYLATE LEVEL: Salicylate Lvl: <7 mg/dL — ABNORMAL LOW (ref 7.0–30.0)

## 2020-10-10 LAB — CBG MONITORING, ED: Glucose-Capillary: 63 mg/dL — ABNORMAL LOW (ref 70–99)

## 2020-10-10 LAB — ACETAMINOPHEN LEVEL: Acetaminophen (Tylenol), Serum: <10 ug/mL — ABNORMAL LOW (ref 10–30)

## 2020-10-10 MED ORDER — LORAZEPAM 2 MG/ML IJ SOLN: 2.0000 mg | Freq: Once | INTRAMUSCULAR | Status: AC

## 2020-10-10 MED ORDER — STERILE WATER FOR INJECTION IJ SOLN
INTRAMUSCULAR | Status: AC
  Filled 2020-10-10: qty 10

## 2020-10-10 MED ORDER — LORAZEPAM 2 MG/ML IJ SOLN
INTRAMUSCULAR | Status: AC
  Administered 2020-10-10: 2 mg via INTRAMUSCULAR
  Filled 2020-10-10: qty 1

## 2020-10-10 MED ORDER — ZIPRASIDONE MESYLATE 20 MG IM SOLR
INTRAMUSCULAR | Status: AC
  Filled 2020-10-10: qty 20

## 2020-10-10 MED ORDER — DIPHENHYDRAMINE HCL 50 MG/ML IJ SOLN
INTRAMUSCULAR | Status: AC
  Filled 2020-10-10: qty 1

## 2020-10-10 NOTE — ED Notes (Signed)
Patient transported to CT 
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
## 2020-10-10 NOTE — ED Notes (Signed)
Geodon 20 mg and Diphenhydramine 50 mg given IM, security and [employer] at bedside.

## 2020-10-10 NOTE — ED Notes (Signed)
Applied monitors to patient, refused oral temperature at this time.

## 2020-10-10 NOTE — ED Notes (Signed)
Felton Clinton (Mother#[phone]) called for update on patient's status.  Thank you

## 2020-10-10 NOTE — Discharge Instructions (Signed)
There were no acute findings on the imaging studies.   Wound Care - General Wound Cleaning: Clean the wound and surrounding area gently with tap water and mild soap. Rinse well and blot dry. You may shower, but avoid submerging the wound, such as with a bath or swimming.  Clean the wound daily to prevent infection.  Do not use cleaners such as hydrogen peroxide or alcohol.   Scar reduction: Application of a topical antibiotic ointment, such as Neosporin, after the wound has begun to heal well can decrease scab formation and reduce scarring. After the wound has healed, application of ointments such as Aquaphor can also reduce scar formation.  The key to scar reduction is keeping the skin well hydrated and supple. Drinking plenty of water throughout the day (At least eight 8oz glasses of water a day) is essential to staying well hydrated.  Sun exposure: Keep the wound out of the sun. After the wound has healed, continue to protect it from the sun by wearing protective clothing or applying sunscreen.  Pain: You may use Tylenol, naproxen, or ibuprofen for pain.  Return: Return to the ED should signs of infection arise, such as spreading redness, puffiness/swelling, pus draining from the wound, severe increase in pain, fever over 100.58F, or any other major issues.  For prescription assistance, may try using prescription discount sites or apps, such as goodrx.com

## 2020-10-10 NOTE — ED Notes (Signed)
Orange juice given to patient, refused water and sandwich at this time

## 2020-10-10 NOTE — ED Notes (Signed)
Patient assisted back to bed by [employer]; patient continues to yell and curse.

## 2020-10-10 NOTE — ED Notes (Signed)
Patient in room, yelling and cursing. Unable to rre-direct.

## 2020-10-10 NOTE — ED Notes (Signed)
Patient ambulatory, assisted by [employer] out of unit.
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
## 2020-10-10 NOTE — ED Provider Notes (Signed)
[facility] EMERGENCY DEPARTMENT Provider Note   CSN: [id number] Arrival date & time: 10/10/20  0757     History Chief Complaint  Patient presents with  . Neck Pain    Keldan Eplin Heber is a 30 y.o. male.  HPI    Level five caveat due to patient's refusal to answer questions.  Danford Tat is a 30 y.o. male, presenting to the ED with violence and aggression in police custody.   [employer] officer Enid Baas offered details on events leading to patient coming to ED. Patient reportedly was holding his wife and daughter (age 33-15) at gunpoint threatening to kill them.  Patient fought during the arrest. He was then kicking and trying to throw himself against the police vehicle inside and out. He then tried to Psychiatrist.   He arrives yelling, cussing, and intermittently lunging at staff members. He complains of neck and back pain as well as a wound to his left cheek. He will not stop yelling to allow a interview or conversation.    History reviewed. No pertinent past medical history.  There are no problems to display for this patient.   Past Surgical History:  Procedure Laterality Date  . BACK SURGERY         No family history on file.  Social History   Tobacco Use  . Smoking status: Current Every Day Smoker    Packs/day: 0.50  . Smokeless tobacco: Never Used  Substance Use Topics  . Alcohol use: Yes    Comment: occ  . Drug use: No    Home Medications Prior to Admission medications   Medication Sig Start Date End Date Taking? Authorizing Provider  acetaminophen (TYLENOL) 325 MG tablet Take 2 tablets (650 mg total) by mouth every 6 (six) hours as needed. Do not take more than  of tylenol per day 01/07/18   Couture, Cortni S, PA-C  acyclovir (ZOVIRAX) 400 MG tablet Take 2 tablets (800 mg total) by mouth 5 (five) times daily. 08/09/19   Garlon Hatchet, PA-C  cyclobenzaprine (FLEXERIL) 10 MG tablet Take 1 tablet (10 mg total) by mouth  3 (three) times daily as needed for muscle spasms. 03/19/17   Rise Mu, PA-C  ibuprofen (ADVIL,MOTRIN) 400 MG tablet Take 1 tablet (400 mg total) by mouth every 6 (six) hours as needed. 01/07/18   Couture, Cortni S, PA-C  naproxen (NAPROSYN) 500 MG tablet Take 1 tablet (500 mg total) by mouth 2 (two) times daily. 02/20/18   Claude Manges, PA-C  oxyCODONE-acetaminophen (PERCOCET) 5-325 MG tablet Take 1 tablet by mouth every 4 (four) hours as needed. 08/09/19   Garlon Hatchet, PA-C    Allergies    Patient has no known allergies.  Review of Systems   Review of Systems  Unable to perform ROS: Other (Refuses to answer questions)    Physical Exam Updated Vital Signs BP (!) 138/92 (BP Location: Right Arm)   Pulse 85   Temp 98.7 F (37.1 C) (Oral)   Resp 18   SpO2 100%   Physical Exam Vitals and nursing note reviewed.  Constitutional:      Appearance: He is well-developed. He is not diaphoretic.  HENT:     Head: Normocephalic.     Comments: Abrasion with mild swelling noted to the left cheek. No noted deformity, instability. The rest of the patient's head and face were examined without other evidence of injury.    Mouth/Throat:     Mouth: Mucous membranes are  moist.     Pharynx: Oropharynx is clear.  Eyes:     Extraocular Movements: Extraocular movements intact.     Conjunctiva/sclera: Conjunctivae normal.     Pupils: Pupils are equal, round, and reactive to light.     Comments: No periorbital swelling, erythema, or other signs of injury. No noted EOM abnormalities when patient would move his eyes to follow me around the room or around his bed.  Cardiovascular:     Rate and Rhythm: Normal rate and regular rhythm.     Pulses: Normal pulses.          Radial pulses are 2+ on the right side and 2+ on the left side.       Posterior tibial pulses are 2+ on the right side and 2+ on the left side.     Heart sounds: Normal heart sounds.     Comments: Tactile temperature in the  extremities appropriate and equal bilaterally. Pulmonary:     Effort: Pulmonary effort is normal. No respiratory distress.     Breath sounds: Normal breath sounds.  Abdominal:     Palpations: Abdomen is soft.     Tenderness: There is no abdominal tenderness. There is no guarding.  Musculoskeletal:     Cervical back: Neck supple.     Right lower leg: No edema.     Left lower leg: No edema.     Comments: When I initially started palpating the patient's spine, he did not have any reaction. He was continuing to yell about being arrested and saying we weren't doing anything to care for him. He then would yell and scream no matter where I palpated on his back or neck. I did not note any areas of swelling, deformity, instability, or other signs of additional injury in the extremities.  Overall trauma exam performed without any abnormalities noted other than those mentioned.  Skin:    General: Skin is warm and dry.     Capillary Refill: Capillary refill takes less than 2 seconds.  Neurological:     Mental Status: He is alert.     Comments: Patient would not comply with answering questions. He is noted to be able to move his fingers, arms, and legs. He demonstrated his ability to jump up and down in place.  Psychiatric:        Mood and Affect: Affect is angry.        Speech: Speech is rapid and pressured.        Behavior: Behavior is aggressive and combative.        Judgment: Judgment is impulsive.        ED Results / Procedures / Treatments   Labs (all labs ordered are listed, but only abnormal results are displayed) Labs Reviewed  COMPREHENSIVE METABOLIC PANEL - Abnormal; Notable for the following components:      Result Value   Creatinine, Ser 1.33 (*)    All other components within normal limits  ETHANOL - Abnormal; Notable for the following components:   Alcohol, Ethyl (B) 163 (*)    All other components within normal limits  RAPID URINE DRUG SCREEN, HOSP PERFORMED - Abnormal;  Notable for the following components:   Tetrahydrocannabinol POSITIVE (*)    All other components within normal limits  ACETAMINOPHEN LEVEL - Abnormal; Notable for the following components:   Acetaminophen (Tylenol), Serum <10 (*)    All other components within normal limits  SALICYLATE LEVEL - Abnormal; Notable for the following components:   Salicylate  Lvl <7.0 (*)    All other components within normal limits  CBG MONITORING, ED - Abnormal; Notable for the following components:   Glucose-Capillary 63 (*)    All other components within normal limits  CBC WITH DIFFERENTIAL/PLATELET    EKG EKG Interpretation  Date/Time:  Sunday October 10 2020 09:09:03 EST Ventricular Rate:  71 PR Interval:    QRS Duration: 94 QT Interval:  355 QTC Calculation: 386 R Axis:   94 Text Interpretation: Sinus rhythm Borderline right axis deviation LVH by voltage No significant change since last tracing Confirmed by Alona Bene [phone]) on 10/10/2020 10:14:25 AM   Radiology CT Head Wo Contrast  Result Date: 10/10/2020 CLINICAL DATA:  30 year old male with head, neck and facial pain following possible injury. Altered mental status. EXAM: CT HEAD WITHOUT CONTRAST CT MAXILLOFACIAL WITHOUT CONTRAST CT CERVICAL SPINE WITHOUT CONTRAST TECHNIQUE: Multidetector CT imaging of the head, cervical spine, and maxillofacial structures were performed using the standard protocol without intravenous contrast. Multiplanar CT image reconstructions of the cervical spine and maxillofacial structures were also generated. COMPARISON:  None. FINDINGS: CT HEAD FINDINGS Brain: No evidence of acute infarction, hemorrhage, hydrocephalus, extra-axial collection or mass lesion/mass effect. Vascular: No hyperdense vessel or unexpected calcification. Skull: Normal. Negative for fracture or focal lesion. Other: None. CT MAXILLOFACIAL FINDINGS Osseous: No fracture or mandibular dislocation. A periapical abscess/cyst of tooth #19 is noted.  Orbits: Negative. No traumatic or inflammatory finding. Sinuses: No acute abnormality. A small retention cyst/polyp within the LEFT maxillary sinuses noted. A RIGHT concha bullosa is identified. Soft tissues: Negative. CT CERVICAL SPINE FINDINGS Alignment: Normal. Skull base and vertebrae: No acute fracture. No primary bone lesion or focal pathologic process. Soft tissues and spinal canal: No prevertebral fluid or swelling. No visible canal hematoma. 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 Disc levels:  Unremarkable Upper chest: No acute abnormality Other: None IMPRESSION: 1. Unremarkable noncontrast head CT. 2. No evidence of acute facial fracture. 3. No static evidence of acute injury to the cervical spine. Congenital central spinal narrowing. 4. Periapical cyst/abscess of tooth 19. Electronically Signed   By: Harmon Pier M.D.   On: 10/10/2020 10:22   CT Cervical Spine Wo Contrast  Result Date: 10/10/2020 CLINICAL DATA:  30 year old male with head, neck and facial pain following possible injury. Altered mental status. EXAM: CT HEAD WITHOUT CONTRAST CT MAXILLOFACIAL WITHOUT CONTRAST CT CERVICAL SPINE WITHOUT CONTRAST TECHNIQUE: Multidetector CT imaging of the head, cervical spine, and maxillofacial structures were performed using the standard protocol without intravenous contrast. Multiplanar CT image reconstructions of the cervical spine and maxillofacial structures were also generated. COMPARISON:  None. FINDINGS: CT HEAD FINDINGS Brain: No evidence of acute infarction, hemorrhage, hydrocephalus, extra-axial collection or mass lesion/mass effect. Vascular: No hyperdense vessel or unexpected calcification. Skull: Normal. Negative for fracture or focal lesion. Other: None. CT MAXILLOFACIAL FINDINGS Osseous: No fracture or mandibular dislocation. A periapical abscess/cyst of tooth #19 is noted. Orbits: Negative. No traumatic or inflammatory finding. Sinuses: No acute  abnormality. A small retention cyst/polyp within the LEFT maxillary sinuses noted. A RIGHT concha bullosa is identified. Soft tissues: Negative. CT CERVICAL SPINE FINDINGS Alignment: Normal. Skull base and vertebrae: No acute fracture. No primary bone lesion or focal pathologic process. Soft tissues and spinal canal: No prevertebral fluid or swelling. No visible canal hematoma. 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 Disc levels:  Unremarkable Upper chest: No acute abnormality Other: None IMPRESSION: 1. Unremarkable noncontrast  head CT. 2. No evidence of acute facial fracture. 3. No static evidence of acute injury to the cervical spine. Congenital central spinal narrowing. 4. Periapical cyst/abscess of tooth 19. Electronically Signed   By: Harmon PierJeffrey  Hu M.D.   On: 10/10/2020 10:22   CT Thoracic Spine Wo Contrast  Result Date: 10/10/2020 CLINICAL DATA:  Patient status post arrest today. Thoracic and low back pain. Initial encounter. EXAM: CT THORACIC SPINE WITHOUT CONTRAST TECHNIQUE: Multidetector CT images of the thoracic were obtained using the standard protocol without intravenous contrast. COMPARISON:  None. FINDINGS: Alignment: No listhesis. There is convex right scoliosis with the apex at approximately T9. Vertebrae: No fracture or focal lesion. The patient is status post T4-L1 fusion. Hardware is intact without loosening. Solid posterior fusion mass is present at all levels. The left T11 screws traverses the left costovertebral articulation and extends into the paraspinous tissues abutting the descending aorta. Paraspinal and other soft tissues: Negative. Disc levels: Intervertebral disc space height is maintained at all levels. IMPRESSION: No acute abnormality. Status post T4-L1 fusion for scoliosis. Hardware is intact and there is a solid posterior fusion mass at all levels. As noted above, the left T11 screw traverses the costovertebral articulation and extends into  the posterior paraspinous soft tissues adjacent to the descending aorta. Electronically Signed   By: Drusilla Kannerhomas  Dalessio M.D.   On: 10/10/2020 10:20   CT Lumbar Spine Wo Contrast  Result Date: 10/10/2020 CLINICAL DATA:  Patient status post arrest today. Thoracic and low back pain. Initial encounter. EXAM: CT LUMBAR SPINE WITHOUT CONTRAST TECHNIQUE: Multidetector CT imaging of the lumbar spine was performed without intravenous contrast administration. Multiplanar CT image reconstructions were also generated. COMPARISON:  Plain films lumbar spine 02/19/2018. Plain films thoracic spine 03/19/2017. FINDINGS: Segmentation: Standard. Alignment: No listhesis. Mild convex left curvature with the apex at L2-3 noted. Vertebrae: No fracture or focal pathologic process. Spinal fusion hardware extends from L1 above the superior margin of the scan. Solid posterior fusion mass is seen at all levels. Paraspinal and other soft tissues: Negative. Disc levels: Intervertebral disc space height is maintained at all levels. Advanced for age facet degenerative disease is present L1-2. IMPRESSION: No acute abnormality. Partial visualization of spinal fusion hardware for scoliosis. Electronically Signed   By: Drusilla Kannerhomas  Dalessio M.D.   On: 10/10/2020 10:13   CT Maxillofacial Wo Contrast  Result Date: 10/10/2020 CLINICAL DATA:  30 year old male with head, neck and facial pain following possible injury. Altered mental status. EXAM: CT HEAD WITHOUT CONTRAST CT MAXILLOFACIAL WITHOUT CONTRAST CT CERVICAL SPINE WITHOUT CONTRAST TECHNIQUE: Multidetector CT imaging of the head, cervical spine, and maxillofacial structures were performed using the standard protocol without intravenous contrast. Multiplanar CT image reconstructions of the cervical spine and maxillofacial structures were also generated. COMPARISON:  None. FINDINGS: CT HEAD FINDINGS Brain: No evidence of acute infarction, hemorrhage, hydrocephalus, extra-axial collection or mass  lesion/mass effect. Vascular: No hyperdense vessel or unexpected calcification. Skull: Normal. Negative for fracture or focal lesion. Other: None. CT MAXILLOFACIAL FINDINGS Osseous: No fracture or mandibular dislocation. A periapical abscess/cyst of tooth #19 is noted. Orbits: Negative. No traumatic or inflammatory finding. Sinuses: No acute abnormality. A small retention cyst/polyp within the LEFT maxillary sinuses noted. A RIGHT concha bullosa is identified. Soft tissues: Negative. CT CERVICAL SPINE FINDINGS Alignment: Normal. Skull base and vertebrae: No acute fracture. No primary bone lesion or focal pathologic process. Soft tissues and spinal canal: No prevertebral fluid or swelling. No visible canal hematoma. Congenital central spinal narrowing noted  with the AP diameter measuring 1 cm. Disc levels:  Unremarkable Upper chest: No acute abnormality Other: None IMPRESSION: 1. Unremarkable noncontrast head CT. 2. No evidence of acute facial fracture. 3. No static evidence of acute injury to the cervical spine. Congenital central spinal narrowing. 4. Periapical cyst/abscess of tooth 19. Electronically Signed   By: Harmon Pier M.D.   On: 10/10/2020 10:22    Procedures Procedures   Medications Ordered in ED Medications  diphenhydrAMINE (BENADRYL) 50 MG/ML injection (has no administration in time range)  ziprasidone (GEODON) 20 MG injection (has no administration in time range)  sterile water (preservative free) injection (has no administration in time range)  LORazepam (ATIVAN) injection 2 mg (2 mg Intramuscular Given 10/10/20 4270)    ED Course  I have reviewed the triage vital signs and the nursing notes.  Pertinent labs & imaging results that were available during my care of the patient were reviewed by me and considered in my medical decision making (see chart for details).  Clinical Course as of 10/10/20 1544  Sun Oct 10, 2020  6237 Patient screaming, "Fuck you you stupid, racist  motherfucker!" and then spit on my face and clothes.  [SJ]  [phone] RN reports patient got up out of bed, even with arms and legs restrained, hopped over to the door of the patient care room, and began screaming and banging his head against the door and window. He was also noted to be hopping up and down. [SJ]  0900 Patient received 20 mg Geodon and 50 mg Benadryl IM. [SJ]  [id number] Patient resting comfortably on the bed.  No longer combative or agitated.  Breathing normal rate.  Lungs clear. CMS intact in the extremities. [SJ]  1000 Patient no longer in full restraints.  [SJ]  1130 CT Thoracic Spine Wo Contrast Spoke with Dr. Lovell Sheehan, neurosurgeon. We discussed the finding of the screw abutting the aorta on this T-spine CT. He states it looks like the hardware has been in place for years and if it didn't cause a problem when it was first placed, then it shouldn't cause a problem now.  [SJ]  1220 BP(!): 95/59 Due to patient lying on his side. [SJ]    Clinical Course User Index [SJ] Joy, Hillard Danker, PA-C   MDM Rules/Calculators/A&P                          Patient presents violent and agitated.  In police custody. Would not allow interview.  Continued to yell over me during attempted interview.  Even despite handcuffs, continue to attempt violent behavior toward staff, spit in my face. Required injected medications (Ativan, then Geodon and Benadryl) for safety of staff and patient.  Also required restraints.  Restraints were removed at the earliest possible time. CMS in the patient's extremities was verified at regular intervals while patient was in handcuffs and restraints.  Patient was also monitored telemetry and SPO2.  At the end of my shift, report was given to EDP Carmell Austria, MD.  Plan: Patient sedated from medications given here in the ED.  He will need to be able to function appropriately in order to be transported to jail.  Final Clinical Impression(s) / ED Diagnoses Final diagnoses:   Medical clearance for incarceration  Abrasion of face, initial encounter    Rx / DC Orders ED Discharge Orders    None       Anselm Pancoast, PA-C 10/10/20 1545  Maia Plan, MD 10/11/20 [phone]

## 2023-02-19 IMAGING — CT CT T SPINE W/O CM
3 of 4 series · 12 of 33 positions shown, 14 images · non-contrast
Comparison: None.

CLINICAL DATA: Patient status post arrest today. Thoracic and low
back pain. Initial encounter.

EXAM:
CT THORACIC SPINE WITHOUT CONTRAST
TECHNIQUE: Multidetector CT images of the thoracic were obtained using the
standard protocol without intravenous contrast.

[Series 5: t-spine 2.0 st · axial · 0.45mm/px · z∈[-466,-294]mm · 4 of 130 slices shown, 5 images]
[im 22/130  soft-tissue]
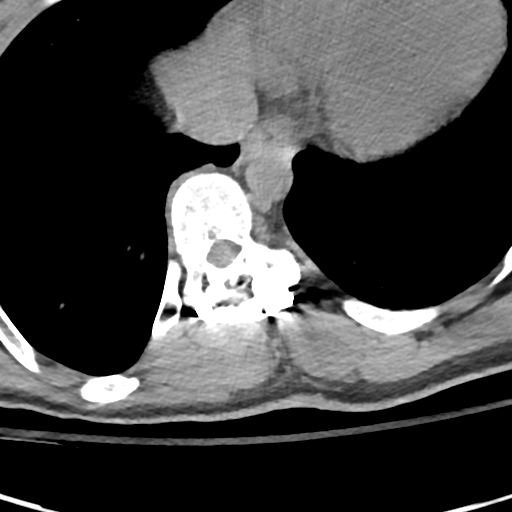
[im 22/130  bone]
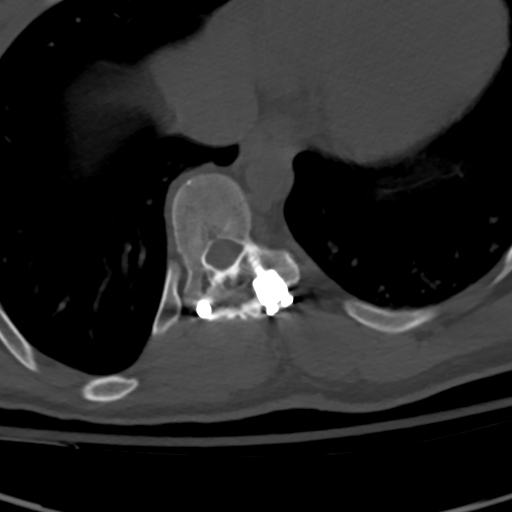
[im 44/130  bone]
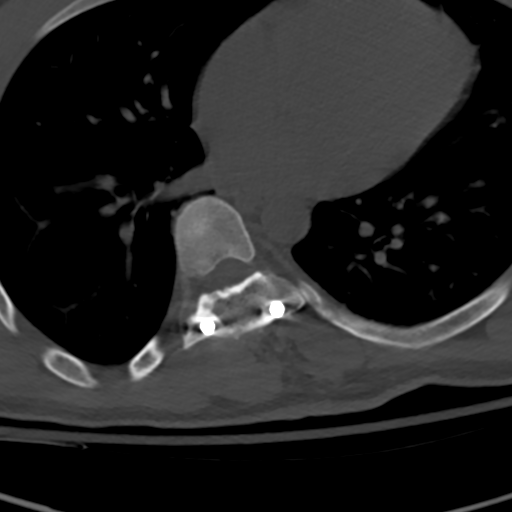
[im 87/130  bone]
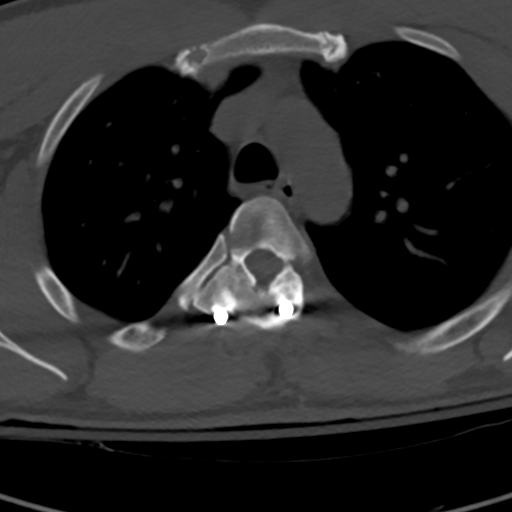
[im 108/130  bone]
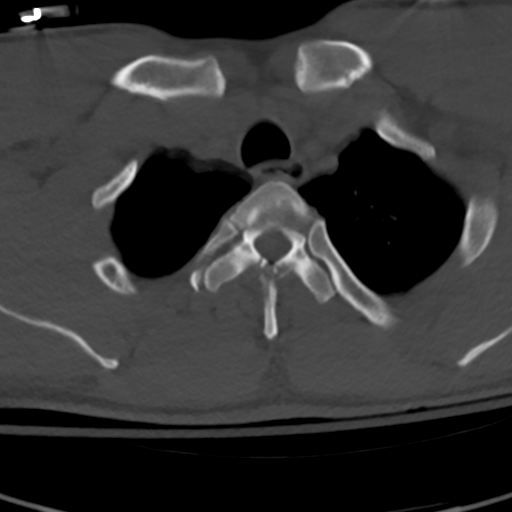

[Series 8: coronal bone · coronal · 0.33mm/px · 3 of 76 slices shown]
[im 16/76  bone]
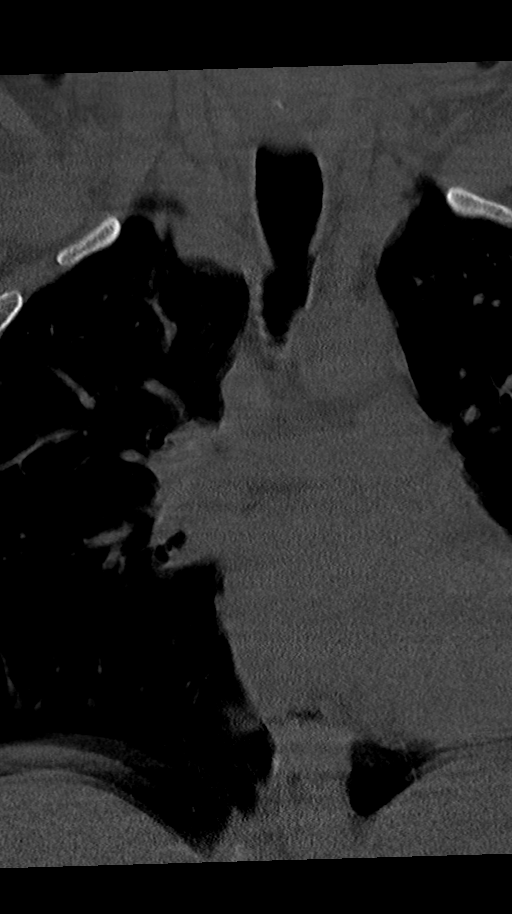
[im 31/76  bone]
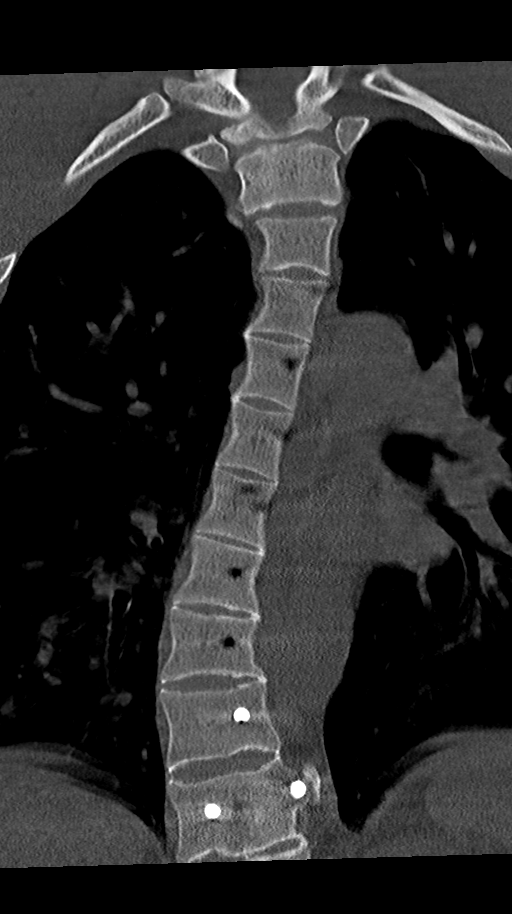
[im 46/76  bone]
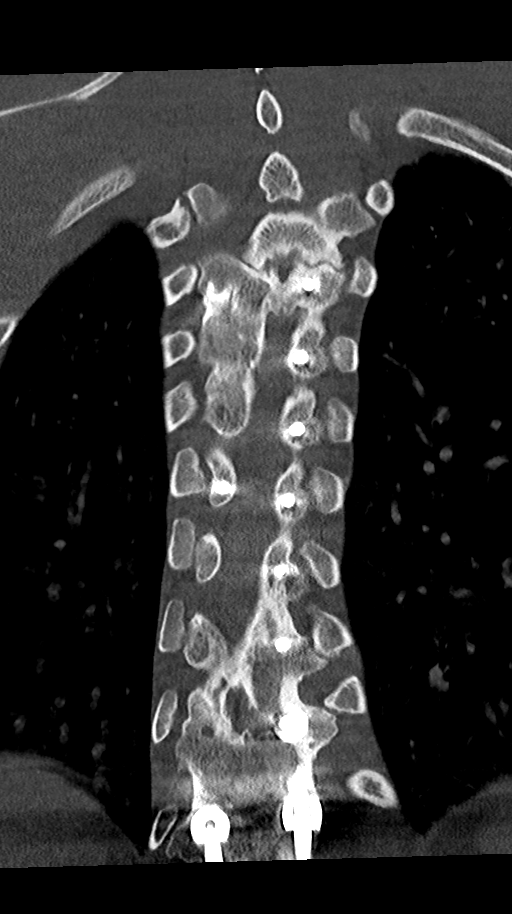

[Series 9: sagittal bone · sagittal · 0.38mm/px · 5 of 71 slices shown, 6 images]
[im 24/71  bone]
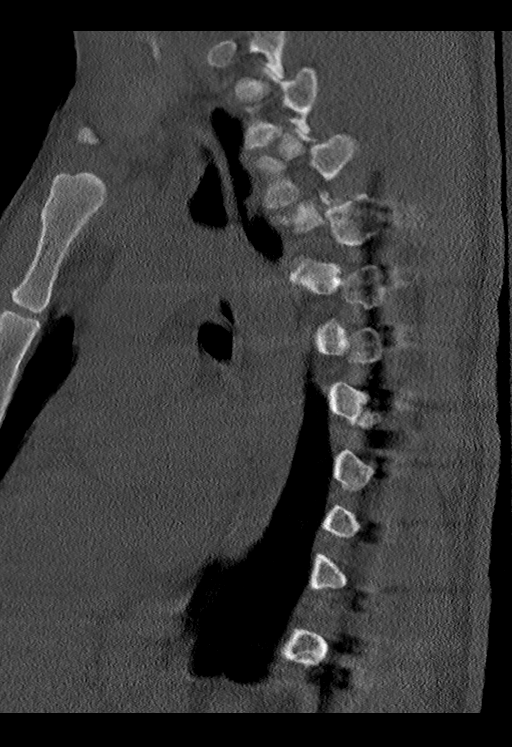
[im 30/71  bone]
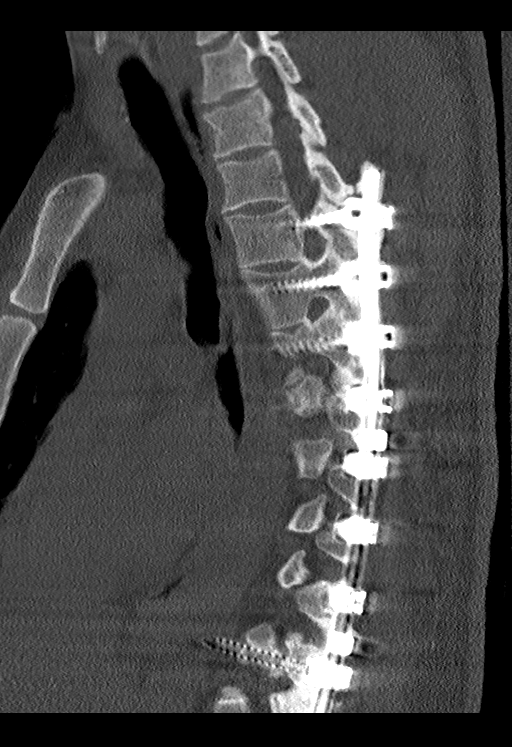
[im 36/71  soft-tissue]
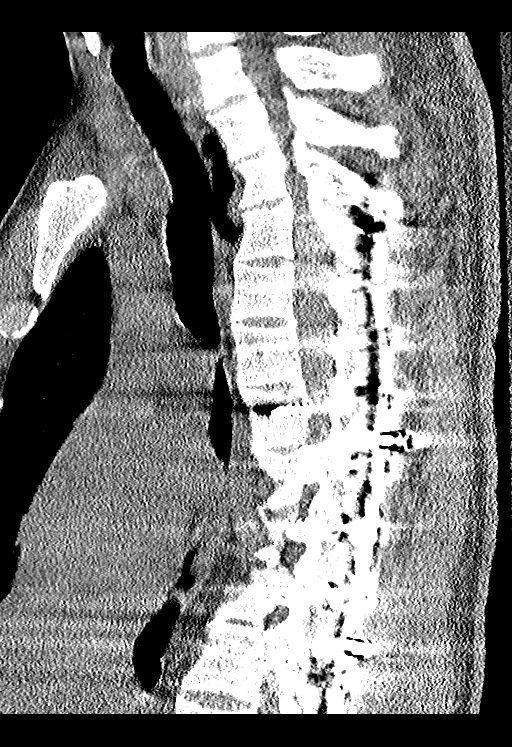
[im 36/71  bone]
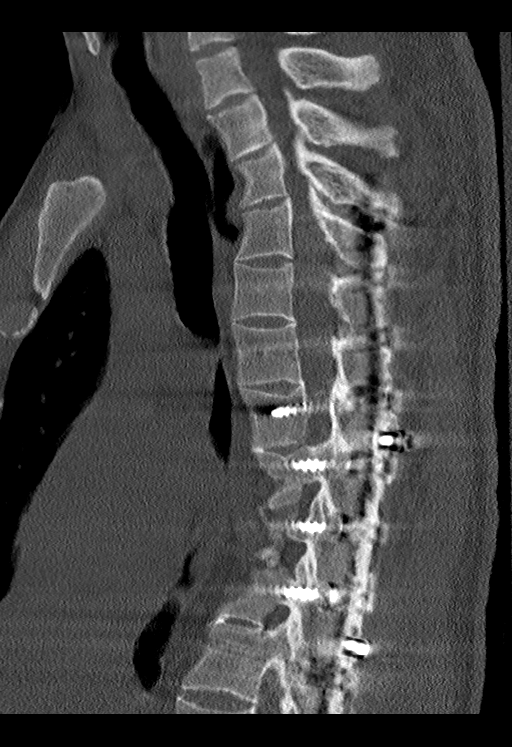
[im 41/71  bone]
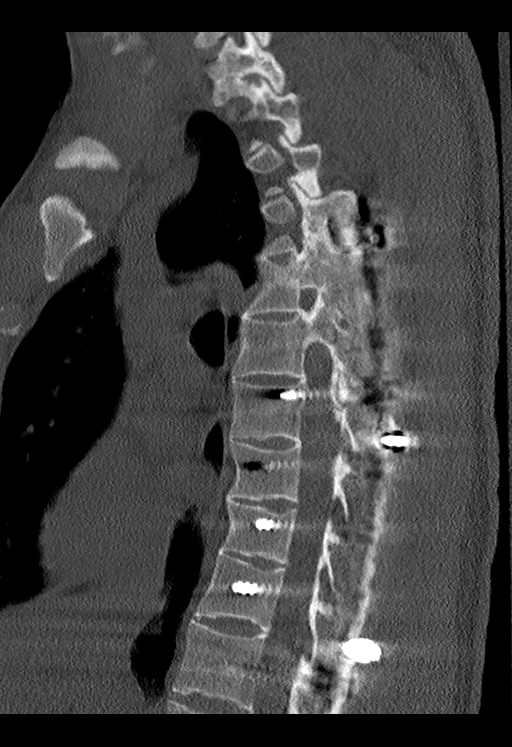
[im 47/71  bone]
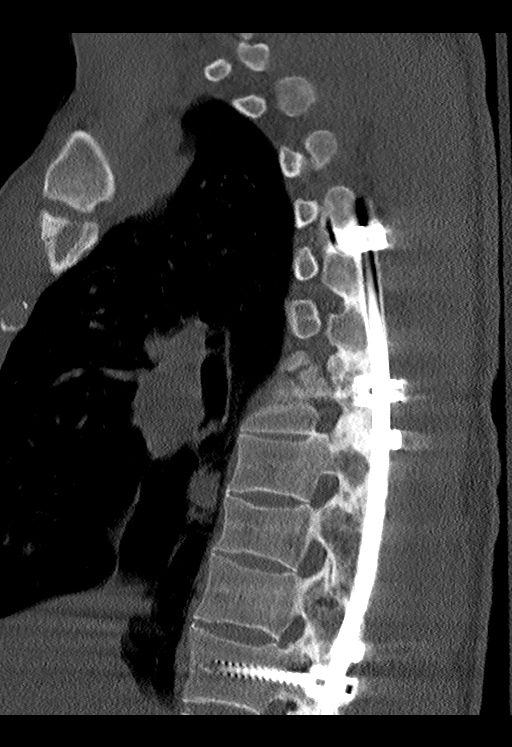

[12 of 33 positions shown; findings below may reference images not displayed]

FINDINGS: Alignment: No listhesis. There is convex right scoliosis with the
apex at approximately T9.

Vertebrae: No fracture or focal lesion. The patient is status post
T4-L1 fusion. Hardware is intact without loosening. Solid posterior
fusion mass is present at all levels. The left T11 screws traverses
the left costovertebral articulation and extends into the
paraspinous tissues abutting the descending aorta.

Paraspinal and other soft tissues: Negative.

Disc levels: Intervertebral disc space height is maintained at all
levels.
IMPRESSION: No acute abnormality.

Status post T4-L1 fusion for scoliosis. Hardware is intact and there
is a solid posterior fusion mass at all levels. As noted above, the
left T11 screw traverses the costovertebral articulation and extends
into the posterior paraspinous soft tissues adjacent to the
descending aorta.
